# Patient Record
Sex: Male | Born: 1935
Health system: Southern US, Community
[De-identification: ages and names within clinical notes are randomized; demographics above are authoritative.]

## PROBLEM LIST (undated history)

## (undated) DIAGNOSIS — E291 Testicular hypofunction: Secondary | ICD-10-CM

## (undated) DIAGNOSIS — K648 Other hemorrhoids: Secondary | ICD-10-CM

## (undated) DIAGNOSIS — K635 Polyp of colon: Secondary | ICD-10-CM

## (undated) DIAGNOSIS — K5790 Diverticulosis of intestine, part unspecified, without perforation or abscess without bleeding: Secondary | ICD-10-CM

## (undated) DIAGNOSIS — N400 Enlarged prostate without lower urinary tract symptoms: Secondary | ICD-10-CM

## (undated) DIAGNOSIS — G629 Polyneuropathy, unspecified: Secondary | ICD-10-CM

## (undated) HISTORY — PX: INGUINAL HERNIA REPAIR: SUR1180

## (undated) HISTORY — DX: Diverticulosis of intestine, part unspecified, without perforation or abscess without bleeding: K57.90

## (undated) HISTORY — DX: Polyneuropathy, unspecified: G62.9

## (undated) HISTORY — PX: SKIN CANCER EXCISION: SHX779

## (undated) HISTORY — DX: Other hemorrhoids: K64.8

## (undated) HISTORY — DX: Polyp of colon: K63.5

## (undated) HISTORY — PX: CATARACT EXTRACTION: SUR2

## (undated) HISTORY — DX: Testicular hypofunction: E29.1

## (undated) HISTORY — DX: Benign prostatic hyperplasia without lower urinary tract symptoms: N40.0

---

## 1999-10-01 ENCOUNTER — Ambulatory Visit (HOSPITAL_COMMUNITY): Admission: RE | Admit: 1999-10-01 | Discharge: 1999-10-01 | Payer: Self-pay | Admitting: Surgery

## 1999-11-19 ENCOUNTER — Other Ambulatory Visit: Admission: RE | Admit: 1999-11-19 | Discharge: 1999-11-19 | Payer: Self-pay | Admitting: Internal Medicine

## 2000-08-24 ENCOUNTER — Ambulatory Visit (HOSPITAL_COMMUNITY): Admission: RE | Admit: 2000-08-24 | Discharge: 2000-08-24 | Payer: Self-pay | Admitting: Ophthalmology

## 2005-10-03 ENCOUNTER — Emergency Department (HOSPITAL_COMMUNITY): Admission: EM | Admit: 2005-10-03 | Discharge: 2005-10-03 | Payer: Self-pay | Admitting: Emergency Medicine

## 2006-10-15 ENCOUNTER — Ambulatory Visit: Payer: Self-pay | Admitting: Internal Medicine

## 2006-10-29 ENCOUNTER — Ambulatory Visit: Payer: Self-pay | Admitting: Internal Medicine

## 2011-07-08 ENCOUNTER — Telehealth: Payer: Self-pay | Admitting: Internal Medicine

## 2011-07-08 NOTE — Telephone Encounter (Signed)
Scheduled patient on 07/14/11 at 2:45 PM with Dr. Juanda Chance. Beth aware and will notify patient and send records.

## 2011-07-08 NOTE — Telephone Encounter (Signed)
Left a message for Angel Pittman to call me.

## 2011-07-09 ENCOUNTER — Encounter: Payer: Self-pay | Admitting: Internal Medicine

## 2011-07-14 ENCOUNTER — Other Ambulatory Visit (INDEPENDENT_AMBULATORY_CARE_PROVIDER_SITE_OTHER): Payer: Medicare Other

## 2011-07-14 ENCOUNTER — Ambulatory Visit (INDEPENDENT_AMBULATORY_CARE_PROVIDER_SITE_OTHER): Payer: Medicare Other | Admitting: Internal Medicine

## 2011-07-14 ENCOUNTER — Encounter: Payer: Self-pay | Admitting: Internal Medicine

## 2011-07-14 VITALS — BP 142/76 | HR 68 | Ht 68.25 in | Wt 154.0 lb

## 2011-07-14 DIAGNOSIS — R634 Abnormal weight loss: Secondary | ICD-10-CM

## 2011-07-14 DIAGNOSIS — Z86010 Personal history of colonic polyps: Secondary | ICD-10-CM

## 2011-07-14 NOTE — Progress Notes (Signed)
EBIN PALAZZI 1931/02/07 MRN [id number]   History of Present Illness:  This is a 77 year old, white male with weight loss from his usual 175 pounds to a current 154 pounds over the past several years. He works out  5 days a week. He has recently started to skip breakfast because he has to wait for his wife to take her medications before she can eat. Instead of this, he has been eating only two meals. His appetite is good. There is no nausea, vomiting or abdominal pain. He is concerned about being stooped or bent over when he walks which seemed to decompress his stomach to the point where he feels full. He has had normal thyroid tests, blood count and renal function as well as liver function tests. We have done numerous colonoscopies starting in 2001 subsequently in September 2004 and the last one in early 2008. He had an adenomatous polyp in 2001. He is due for a repeat colonoscopy in January 2015. He denies any change in bowel habits or rectal bleeding. He denies gastroesophageal reflux. He denies night sweats or chills. His overall energy level has been good.   Past Medical History  Diagnosis Date  . Peripheral neuropathy   . Hypogonadism male   . Internal hemorrhoids   . Diverticulosis   . Hyperplastic colon polyp   . BPH (benign prostatic hypertrophy)    Past Surgical History  Procedure Date  . Inguinal hernia repair     right  . Cataract extraction     bilateral  . Skin cancer excision     reports that he quit smoking about 33 years ago. He has never used smokeless tobacco. He reports that he drinks alcohol. He reports that he does not use illicit drugs. family history includes Cancer in his mother and Heart disease in his father. Allergies  Allergen Reactions  . Penicillins         Review of Systems: Negative for dysphagia odynophagia or shortness of breath or chest pain  The remainder of the 10  point ROS is negative except as outlined in H&P   Physical Exam: General  appearance  Well developed, in no distress. Eyes- non icteric. HEENT nontraumatic, normocephalic. Mouth no lesions, tongue papillated, no cheilosis. Neck supple without adenopathy, thyroid not enlarged, no carotid bruits, no JVD. Lungs Clear to auscultation bilaterally. Back he has kyphosis of the thoracic spine. Cor normal S1 normal S2, regular rhythm , no murmur,  quiet precordium. Abdomen soft nontender abdomen with normoactive bowel sounds. No bruits. Liver edge at costal margin soft . Rectal: Soft Hemoccult negative stool. Extremities no pedal edema. Skin no lesions, Dupuytren contractures. Neurological alert and oriented x 3. Psychological normal mood . There is no inguinal or cervical adenopathy. Good peripheral pulses  Assessment and Plan:  Problem #1 This is a 77 year old black male with a gradual weight loss of about 15 pounds without obvious cause other than decreased food intake. We need to rule out organic causes such as an occult malignancy. We will proceed with a CT scan of the abdomen and pelvis with IV and oral contrast. I will also check his visceral protein specifically prealbumin and general nutritional status. We have discussed increased caloric intake by eating breakfast in addition to lunch and supper. I advise using boost once or twice a day. He will be due for a repeat colonoscopy in 2015.    07/14/2011 Lina Sar

## 2011-07-14 NOTE — Patient Instructions (Signed)
You have been scheduled for a CT scan of the abdomen and pelvis at [facility] ([address]---this is in the same building as [facility]).   You are scheduled on Thursday 07/17/11 at 11:00 am. You should arrive 15 minutes prior to your appointment time for registration. Please follow the written instructions below on the day of your exam:  WARNING: IF YOU ARE ALLERGIC TO IODINE/X-RAY DYE, PLEASE NOTIFY RADIOLOGY IMMEDIATELY AT [phone]! YOU WILL BE GIVEN A 13 HOUR PREMEDICATION PREP.  1) Do not eat or drink anything after 7:00 am (4 hours prior to your test) 2) You have been given 2 bottles of oral contrast to drink. The solution may taste better if refrigerated, but do NOT add ice or any other liquid to this solution. Shake well before drinking.    Drink 1 bottle of contrast @ 9:00 am (2 hours prior to your exam)  Drink 1 bottle of contrast @ 10:00 am (1 hour prior to your exam)  You may take any medications as prescribed with a small amount of water except for the following: Metformin, Glucophage, Glucovance, Avandamet, Riomet, Fortamet, Actoplus Met, Janumet, Glumetza or Metaglip. The above medications must be held the day of the exam AND 48 hours after the exam.  The purpose of you drinking the oral contrast is to aid in the visualization of your intestinal tract. The contrast solution may cause some diarrhea. Before your exam is started, you will be given a small amount of fluid to drink. Depending on your individual set of symptoms, you may also receive an intravenous injection of x-ray contrast/dye. Plan on being at [facility] for 30 minutes or long, depending on the type of exam you are having performed.  If you have any questions regarding your exam or if you need to reschedule, you may call the CT department at [phone] between the hours of 8:00 am and 5:00 pm,  Monday-Friday.  ________________________________________________________________________  Your physician has requested that you go to the basement for the following lab work before leaving today: Prealbumin, BUN, Creatinine CC: Dr Catha Gosselin

## 2011-07-15 LAB — PREALBUMIN: Prealbumin: 25.6 mg/dL (ref 17.0–34.0)

## 2011-07-16 ENCOUNTER — Telehealth: Payer: Self-pay | Admitting: Internal Medicine

## 2011-07-16 NOTE — Telephone Encounter (Signed)
Unable to reach patient. No answering machine. Will try again later. 

## 2011-07-16 NOTE — Telephone Encounter (Signed)
Please send message to radiology to evaluate LS spine for spinal stenosis while they are doing the CT scan.

## 2011-07-16 NOTE — Telephone Encounter (Signed)
Patient states he found out last night that his father had lumbar spinal stenosis and the symptoms he is having are similar. He states the diagnosis for this is with MRI or CT. He wants to be sure that the CT tomorrow would show enough to diagnosis this also as he does not want to have too may CT's> ( he is having abd/pelvis CT) Please, advise.

## 2011-07-16 NOTE — Progress Notes (Signed)
Advised patient wife that labs are normal.

## 2011-07-16 NOTE — Telephone Encounter (Signed)
Spoke with Rose at [facility] and she will add the CT L spine for Spinal stenosis

## 2011-07-17 ENCOUNTER — Ambulatory Visit (INDEPENDENT_AMBULATORY_CARE_PROVIDER_SITE_OTHER)
Admission: RE | Admit: 2011-07-17 | Discharge: 2011-07-17 | Disposition: A | Discharge status: Home / Self Care | Payer: Medicare Other | Source: Ambulatory Visit | Attending: Internal Medicine | Admitting: Internal Medicine

## 2011-07-17 DIAGNOSIS — R634 Abnormal weight loss: Secondary | ICD-10-CM

## 2011-07-17 DIAGNOSIS — R109 Unspecified abdominal pain: Secondary | ICD-10-CM

## 2011-07-17 MED ORDER — IOHEXOL 300 MG/ML  SOLN: 100.0000 mL | Freq: Once | INTRAMUSCULAR | Status: DC | PRN

## 2011-07-18 ENCOUNTER — Telehealth: Payer: Self-pay | Admitting: *Deleted

## 2011-07-18 NOTE — Telephone Encounter (Signed)
Patient notified of results as per Dr. Brodie. 

## 2011-07-18 NOTE — Telephone Encounter (Signed)
Message copied by Daphine Deutscher on Fri Jul 18, 2011  8:15 AM ------      Message from: Hart Carwin      Created: Thu Jul 17, 2011  4:47 PM       I have spoken to pt's wife, pt was not at home, I called again later and left message on the phone about normal CT of the abdomen but very abnormal LS spine. Please call him to tell him that he  Will need to be seen by a specialist to evaluate  multilevel disc abnormalities which are leading to his back problems.

## 2011-07-22 ENCOUNTER — Ambulatory Visit (INDEPENDENT_AMBULATORY_CARE_PROVIDER_SITE_OTHER): Payer: Medicare Other | Admitting: Urology

## 2011-07-22 DIAGNOSIS — N529 Male erectile dysfunction, unspecified: Secondary | ICD-10-CM

## 2011-07-22 DIAGNOSIS — N400 Enlarged prostate without lower urinary tract symptoms: Secondary | ICD-10-CM

## 2011-07-22 DIAGNOSIS — E291 Testicular hypofunction: Secondary | ICD-10-CM

## 2011-07-22 DIAGNOSIS — R39198 Other difficulties with micturition: Secondary | ICD-10-CM

## 2011-07-31 ENCOUNTER — Other Ambulatory Visit: Payer: Self-pay | Admitting: Neurosurgery

## 2011-07-31 DIAGNOSIS — M545 Low back pain: Secondary | ICD-10-CM

## 2011-07-31 DIAGNOSIS — M542 Cervicalgia: Secondary | ICD-10-CM

## 2011-08-07 ENCOUNTER — Ambulatory Visit
Admission: RE | Admit: 2011-08-07 | Discharge: 2011-08-07 | Disposition: A | Discharge status: Home / Self Care | Payer: Medicare Other | Source: Ambulatory Visit | Attending: Neurosurgery | Admitting: Neurosurgery

## 2011-08-07 DIAGNOSIS — M542 Cervicalgia: Secondary | ICD-10-CM

## 2011-08-07 DIAGNOSIS — M545 Low back pain, unspecified: Secondary | ICD-10-CM

## 2011-09-30 ENCOUNTER — Other Ambulatory Visit: Payer: Self-pay | Admitting: Urology

## 2011-10-09 ENCOUNTER — Encounter (HOSPITAL_BASED_OUTPATIENT_CLINIC_OR_DEPARTMENT_OTHER): Payer: Self-pay | Admitting: *Deleted

## 2011-10-09 NOTE — Progress Notes (Addendum)
To [facility] at 0615. NPO after mn, Hg,EKG on arrival,reviewed RCC guidelines. 

## 2011-10-12 NOTE — H&P (Signed)
Urology Admission H&P  Chief Complaint: Voiding problems  History of Present Illness:   I have been seeing Angel Pittman for years for BPH and hypogonadism. He has been on AndroGel supplementation and has had appropriately normal levels of testosterone since he has been on AndroGel. He has had normal PSA checks. He had been on finasteride for BPH, but stopped that due to significant concerns about weakness. Since he stopped that, he has been feeling better. He is undergoing neurologic evaluation I Dr. Anne Hahn, and will be seen at [facility] in a few weeks. He recently stopped his testosterone as well.  Over the past year or so, he has lost 18 pounds. His appetite is somewhat diminished. His main concern is the weight loss, as well as progressive stooping, weakness of his hands and easy fatigability. He has seen Dr. Lesia Sago in the past. No specific neurologic diagnosis has been given. He has had a normal GI evaluation by Dr. Lina Sar. Recent CT the abdomen and pelvis revealed normal kidneys, bilateral bladder diverticula, and a large prostate.  He has had slowing of his urinary stream recently. He has had no gross hematuria or dysuria. At his last visit in May, he was found to have microscopic hematuria. He has not really had that before.  Evaluation was essentially negative. Because of his being off of 5ARIs, his symptoms from his prostate have worsened. He has requested intervention besides pharmacologic management. Recent cystoscopy revealed a large, obstructing prostate with an intravesical median lobe, obviating treatment with thermotherapy. He presents now for TUR-P.   Past Medical History  Diagnosis Date  . Hypogonadism male   . Internal hemorrhoids   . Diverticulosis   . Hyperplastic colon polyp   . BPH (benign prostatic hypertrophy)   . Peripheral neuropathy     toes,hands at time-denies any pain   Past Surgical History  Procedure Date  . Inguinal hernia repair     right  . Cataract  extraction     bilateral  . Skin cancer excision     Home Medications:  No prescriptions prior to admission   Allergies:  Allergies  Allergen Reactions  . Penicillins Rash    Family History  Problem Relation Age of Onset  . Cancer Mother     ?  Marland Kitchen Heart disease Father    Social History:  reports that he quit smoking about 33 years ago. He has never used smokeless tobacco. He reports that he drinks alcohol. He reports that he does not use illicit drugs.  Review of Systems  HENT: Negative.   Eyes: Negative.   Respiratory: Negative.   Cardiovascular: Negative.   Gastrointestinal: Negative.   Genitourinary: Negative.   Musculoskeletal: Negative.   Skin: Negative.   Neurological: Positive for weakness.       Generalized weakness  All other systems reviewed and are negative.    Physical Exam:  Vital signs in last 24 hours:   Physical Exam  Vitals reviewed. Constitutional: He is oriented to person, place, and time. He appears well-developed and well-nourished.  HENT:  Head: Normocephalic and atraumatic.  Eyes: Conjunctivae are normal. Pupils are equal, round, and reactive to light.  Neck: Normal range of motion. Neck supple.  Cardiovascular: Normal rate, regular rhythm and normal heart sounds.   Respiratory: Effort normal and breath sounds normal.  GI: Soft. Bowel sounds are normal.  Genitourinary: Rectum normal and penis normal.       Prostate enlarged  Musculoskeletal: Normal range of motion.  Neurological: He  is alert and oriented to person, place, and time.  Skin: Skin is warm and dry.  Psychiatric: He has a normal mood and affect. His behavior is normal.    Laboratory Data:  No results found for this or any previous visit (from the past 24 hour(s)). No results found for this or any previous visit (from the past 240 hour(s)). Creatinine: No results found for this basename: CREATININE:7 in the last 168 hours Baseline Creatinine:    Impression/Assessment:    BPH--significant voiding symptoms, intolerant of pharmacologic therapy  Plan:  TUR-P  Marcine Matar M 10/12/2011, 4:20 PM

## 2011-10-13 ENCOUNTER — Other Ambulatory Visit: Payer: Self-pay

## 2011-10-13 ENCOUNTER — Encounter (HOSPITAL_BASED_OUTPATIENT_CLINIC_OR_DEPARTMENT_OTHER)
Admission: RE | Disposition: A | Discharge status: Home / Self Care | Payer: Self-pay | Source: Ambulatory Visit | Attending: Urology

## 2011-10-13 ENCOUNTER — Encounter (HOSPITAL_BASED_OUTPATIENT_CLINIC_OR_DEPARTMENT_OTHER): Payer: Self-pay | Admitting: *Deleted

## 2011-10-13 ENCOUNTER — Ambulatory Visit (HOSPITAL_BASED_OUTPATIENT_CLINIC_OR_DEPARTMENT_OTHER)
Admission: RE | Admit: 2011-10-13 | Discharge: 2011-10-14 | Disposition: A | Discharge status: Home / Self Care | Payer: Medicare Other | Source: Ambulatory Visit | Attending: Urology | Admitting: Urology

## 2011-10-13 ENCOUNTER — Encounter (HOSPITAL_BASED_OUTPATIENT_CLINIC_OR_DEPARTMENT_OTHER): Payer: Self-pay | Admitting: Anesthesiology

## 2011-10-13 ENCOUNTER — Other Ambulatory Visit: Payer: Self-pay | Admitting: Urology

## 2011-10-13 ENCOUNTER — Ambulatory Visit (HOSPITAL_BASED_OUTPATIENT_CLINIC_OR_DEPARTMENT_OTHER): Payer: Medicare Other | Admitting: Anesthesiology

## 2011-10-13 DIAGNOSIS — Z860100 Personal history of colon polyps, unspecified: Secondary | ICD-10-CM | POA: Insufficient documentation

## 2011-10-13 DIAGNOSIS — N320 Bladder-neck obstruction: Secondary | ICD-10-CM | POA: Insufficient documentation

## 2011-10-13 DIAGNOSIS — N138 Other obstructive and reflux uropathy: Secondary | ICD-10-CM | POA: Insufficient documentation

## 2011-10-13 DIAGNOSIS — Z01810 Encounter for preprocedural cardiovascular examination: Secondary | ICD-10-CM | POA: Insufficient documentation

## 2011-10-13 DIAGNOSIS — R634 Abnormal weight loss: Secondary | ICD-10-CM | POA: Insufficient documentation

## 2011-10-13 DIAGNOSIS — R5381 Other malaise: Secondary | ICD-10-CM | POA: Insufficient documentation

## 2011-10-13 DIAGNOSIS — N400 Enlarged prostate without lower urinary tract symptoms: Secondary | ICD-10-CM

## 2011-10-13 DIAGNOSIS — N323 Diverticulum of bladder: Secondary | ICD-10-CM | POA: Insufficient documentation

## 2011-10-13 DIAGNOSIS — G608 Other hereditary and idiopathic neuropathies: Secondary | ICD-10-CM | POA: Insufficient documentation

## 2011-10-13 DIAGNOSIS — N401 Enlarged prostate with lower urinary tract symptoms: Secondary | ICD-10-CM | POA: Insufficient documentation

## 2011-10-13 HISTORY — PX: TRANSURETHRAL RESECTION OF PROSTATE: SHX73

## 2011-10-13 SURGERY — TRANSURETHRAL RESECTION OF THE PROSTATE WITH GYRUS INSTRUMENTS
Anesthesia: General | Site: Bladder | Wound class: Clean Contaminated

## 2011-10-13 MED ORDER — ONE-DAILY MULTI VITAMINS PO TABS: 1.0000 | ORAL_TABLET | Freq: Every day | ORAL | Status: DC

## 2011-10-13 MED ORDER — ONDANSETRON HCL 4 MG/2ML IJ SOLN
INTRAMUSCULAR | Status: DC | PRN
  Administered 2011-10-13: 4 mg via INTRAVENOUS

## 2011-10-13 MED ORDER — SODIUM CHLORIDE 0.9 % IR SOLN
Status: DC | PRN
  Administered 2011-10-13: 51000 mL

## 2011-10-13 MED ORDER — FENTANYL CITRATE 0.05 MG/ML IJ SOLN
25.0000 ug | INTRAMUSCULAR | Status: DC | PRN
  Administered 2011-10-13 (×2): 25 ug via INTRAVENOUS

## 2011-10-13 MED ORDER — DOCUSATE SODIUM 100 MG PO CAPS
100.0000 mg | ORAL_CAPSULE | Freq: Two times a day (BID) | ORAL | Status: DC
  Administered 2011-10-13: 100 mg via ORAL

## 2011-10-13 MED ORDER — PROPOFOL 10 MG/ML IV EMUL
INTRAVENOUS | Status: DC | PRN
  Administered 2011-10-13: 130 mg via INTRAVENOUS

## 2011-10-13 MED ORDER — ACETAMINOPHEN 325 MG PO TABS: 650.0000 mg | ORAL_TABLET | ORAL | Status: DC | PRN

## 2011-10-13 MED ORDER — STERILE WATER FOR IRRIGATION IR SOLN
Status: DC | PRN
  Administered 2011-10-13: 500 mL

## 2011-10-13 MED ORDER — BELLADONNA ALKALOIDS-OPIUM 16.2-60 MG RE SUPP
RECTAL | Status: DC | PRN
  Administered 2011-10-13: 1 via RECTAL

## 2011-10-13 MED ORDER — FENTANYL CITRATE 0.05 MG/ML IJ SOLN
INTRAMUSCULAR | Status: DC | PRN
  Administered 2011-10-13: 50 ug via INTRAVENOUS
  Administered 2011-10-13: 25 ug via INTRAVENOUS
  Administered 2011-10-13 (×2): 50 ug via INTRAVENOUS
  Administered 2011-10-13: 25 ug via INTRAVENOUS

## 2011-10-13 MED ORDER — CALCIUM GLUCONATE 500 MG PO TABS: 500.0000 mg | ORAL_TABLET | Freq: Every day | ORAL | Status: DC

## 2011-10-13 MED ORDER — HYDROCODONE-ACETAMINOPHEN 5-325 MG PO TABS: 1.0000 | ORAL_TABLET | ORAL | Status: DC | PRN

## 2011-10-13 MED ORDER — GLYCOPYRROLATE 0.2 MG/ML IJ SOLN
INTRAMUSCULAR | Status: DC | PRN
  Administered 2011-10-13 (×2): 0.2 mg via INTRAVENOUS

## 2011-10-13 MED ORDER — PROMETHAZINE HCL 25 MG/ML IJ SOLN: 6.2500 mg | INTRAMUSCULAR | Status: DC | PRN

## 2011-10-13 MED ORDER — CIPROFLOXACIN HCL 250 MG PO TABS
250.0000 mg | ORAL_TABLET | Freq: Two times a day (BID) | ORAL | Status: AC
  Administered 2011-10-13: 250 mg via ORAL

## 2011-10-13 MED ORDER — ZOLPIDEM TARTRATE 5 MG PO TABS: 5.0000 mg | ORAL_TABLET | Freq: Every evening | ORAL | Status: DC | PRN

## 2011-10-13 MED ORDER — SODIUM CHLORIDE 0.9 % IV SOLN: 250.0000 mL | INTRAVENOUS | Status: DC

## 2011-10-13 MED ORDER — LIDOCAINE HCL (CARDIAC) 20 MG/ML IV SOLN
INTRAVENOUS | Status: DC | PRN
  Administered 2011-10-13: 60 mg via INTRAVENOUS

## 2011-10-13 MED ORDER — BELLADONNA ALKALOIDS-OPIUM 16.2-60 MG RE SUPP: 1.0000 | Freq: Four times a day (QID) | RECTAL | Status: DC | PRN

## 2011-10-13 MED ORDER — SODIUM CHLORIDE 0.9 % IJ SOLN: 3.0000 mL | INTRAMUSCULAR | Status: DC | PRN

## 2011-10-13 MED ORDER — DEXAMETHASONE SODIUM PHOSPHATE 4 MG/ML IJ SOLN
INTRAMUSCULAR | Status: DC | PRN
  Administered 2011-10-13: 10 mg via INTRAVENOUS

## 2011-10-13 MED ORDER — LACTATED RINGERS IV SOLN
INTRAVENOUS | Status: DC
  Administered 2011-10-13 (×2): via INTRAVENOUS

## 2011-10-13 MED ORDER — CIPROFLOXACIN IN D5W 400 MG/200ML IV SOLN
400.0000 mg | INTRAVENOUS | Status: AC
  Administered 2011-10-13: 400 mg via INTRAVENOUS

## 2011-10-13 MED ORDER — LACTATED RINGERS IV SOLN
INTRAVENOUS | Status: DC
  Administered 2011-10-13: 17:00:00 via INTRAVENOUS

## 2011-10-13 SURGICAL SUPPLY — 34 items
BAG DRAIN URO-CYSTO SKYTR STRL (DRAIN) ×2 IMPLANT
BAG DRN ANRFLXCHMBR STRAP LEK (BAG) ×1
BAG DRN UROCATH (DRAIN) ×1
BAG URINE DRAINAGE (UROLOGICAL SUPPLIES) ×1 IMPLANT
BAG URINE LEG 19OZ MD ST LTX (BAG) ×1 IMPLANT
CANISTER SUCT LVC 12 LTR MEDI- (MISCELLANEOUS) ×5 IMPLANT
CATH FOLEY 2WAY SLVR  5CC 20FR (CATHETERS)
CATH FOLEY 2WAY SLVR  5CC 22FR (CATHETERS)
CATH FOLEY 2WAY SLVR 30CC 22FR (CATHETERS) IMPLANT
CATH FOLEY 2WAY SLVR 5CC 20FR (CATHETERS) IMPLANT
CATH FOLEY 2WAY SLVR 5CC 22FR (CATHETERS) IMPLANT
CATH FOLEY 3WAY 30CC 22F (CATHETERS) IMPLANT
CATH FOLEY 3WAY 30CC 22FR (CATHETERS) ×1 IMPLANT
CATH HEMA 3WAY 30CC 24FR COUDE (CATHETERS) IMPLANT
CATH HEMA 3WAY 30CC 24FR RND (CATHETERS) IMPLANT
CLOTH BEACON ORANGE TIMEOUT ST (SAFETY) ×2 IMPLANT
DRAPE CAMERA CLOSED 9X96 (DRAPES) ×2 IMPLANT
ELECT BUTTON HF 24-28F 2 30DE (ELECTRODE) ×2 IMPLANT
ELECT LOOP MED HF 24F 12D CBL (CLIP) IMPLANT
ELECT REM PT RETURN 9FT ADLT (ELECTROSURGICAL)
ELECTRODE REM PT RTRN 9FT ADLT (ELECTROSURGICAL) ×1 IMPLANT
EVACUATOR MICROVAS BLADDER (UROLOGICAL SUPPLIES) ×1 IMPLANT
GLOVE BIO SURGEON STRL SZ 6.5 (GLOVE) ×1 IMPLANT
GLOVE BIO SURGEON STRL SZ8 (GLOVE) IMPLANT
GLOVE ECLIPSE 6.0 STRL STRAW (GLOVE) ×1 IMPLANT
GOWN PREVENTION PLUS LG XLONG (DISPOSABLE) ×2 IMPLANT
GOWN STRL REIN XL XLG (GOWN DISPOSABLE) ×2 IMPLANT
HOLDER FOLEY CATH W/STRAP (MISCELLANEOUS) IMPLANT
IV NS IRRIG 3000ML ARTHROMATIC (IV SOLUTION) ×16 IMPLANT
KIT ASPIRATION TUBING (SET/KITS/TRAYS/PACK) IMPLANT
PACK CYSTOSCOPY (CUSTOM PROCEDURE TRAY) ×2 IMPLANT
PLUG CATH AND CAP STER (CATHETERS) IMPLANT
SYR 30ML LL (SYRINGE) ×1 IMPLANT
SYRINGE IRR TOOMEY STRL 70CC (SYRINGE) IMPLANT

## 2011-10-13 NOTE — Op Note (Signed)
Preoperative diagnosis: 1. Bladder outlet obstruction secondary to BPH  Postoperative diagnosis:  1. Bladder outlet obstruction secondary to BPH  Procedure:  1. Cystoscopy 2. Transurethral resection of the prostate  Surgeon: Bertram Millard. Cameron Schwinn, M.D.  Anesthesia: General  Complications: None  Drain: Foley catheter  EBL: Minimal  Specimens: 1. Prostate chips  Disposition of specimens: Pathology  Indication: Angel Pittman is a patient with bladder outlet obstruction secondary to benign prostatic hyperplasia. After reviewing the management options for treatment, he elected to proceed with the above surgical procedure(s). We have discussed the potential benefits and risks of the procedure, side effects of the proposed treatment, the likelihood of the patient achieving the goals of the procedure, and any potential problems that might occur during the procedure or recuperation. Informed consent has been obtained.  Description of procedure:  The patient was identified in the holding area.He received preoperative antibiotics. He was then taken to the operating room. General anesthetic was administered.  The patient was then placed in the dorsal lithotomy position, prepped and draped in the usual sterile fashion. Timeout was then performed.  A resectoscope sheath was placed using the obturator, and the resectoscope,Gyrus  loop and telescope were placed.  The bladder was then systematically examined in its entirety. There was no evidence of  tumors, stones, or other mucosal pathology.  The ureteral orifices were identified and marked so as to be avoided during the procedure.  The prostate adenoma was then resected utilizing loop cautery resection with the monopolar/bipolar cutting loop.  The prostate adenoma from the bladder neck back to the verumontanum was resected beginning at the six o'clock position and then extended to include the right and left lobes of the prostate and anterior  prostate, respectively.. Care was taken not to resect distal to the verumontanum.   The prostate adenoma was then vaporized utilizing the bipolar button.  The prostate adenoma from the bladder neck back to the verumontanum was vaporized beginning at the six o'clock position and then extended to include the right and left lobes of the prostate and anterior prostate. Care was taken not to vaporize distal to the verumontanum.  Hemostasis was then achieved with the cautery and the bladder was emptied and reinspected with no significant bleeding noted at the end of the procedure.    A 3 way catheter was then placed into the bladder and placed on continuous bladder irrigation.  The patient appeared to tolerate the procedure well and without complications.  The patient was able to be awakened and transferred to the recovery unit in satisfactory condition.

## 2011-10-13 NOTE — Anesthesia Procedure Notes (Signed)
Procedure Name: LMA Insertion Date/Time: 10/13/2011 7:42 AM Performed by: Huel Coventry Pre-anesthesia Checklist: Patient identified, Emergency Drugs available, Suction available and Patient being monitored Patient Re-evaluated:Patient Re-evaluated prior to inductionOxygen Delivery Method: Circle System Utilized Preoxygenation: Pre-oxygenation with 100% oxygen Intubation Type: IV induction Ventilation: Mask ventilation without difficulty LMA: LMA inserted LMA Size: 4.0 Number of attempts: 1 Airway Equipment and Method: bite block Placement Confirmation: positive ETCO2 Tube secured with: Tape Dental Injury: Teeth and Oropharynx as per pre-operative assessment

## 2011-10-13 NOTE — Interval H&P Note (Signed)
History and Physical Interval Note:  10/13/2011 7:34 AM  Angel Pittman  has presented today for surgery, with the diagnosis of benigh prostatic hypertrophy  The various methods of treatment have been discussed with the patient and family. After consideration of risks, benefits and other options for treatment, the patient has consented to  Procedure(s): TRANSURETHRAL RESECTION OF THE PROSTATE WITH GYRUS INSTRUMENTS as a surgical intervention .  The patients' history has been reviewed, patient examined, no change in status, stable for surgery.  I have reviewed the patients' chart and labs.  Questions were answered to the patient's satisfaction.     [name]

## 2011-10-13 NOTE — Transfer of Care (Signed)
Immediate Anesthesia Transfer of Care Note  Patient: Angel Pittman  Procedure(s) Performed:  TRANSURETHRAL RESECTION OF THE PROSTATE WITH GYRUS INSTRUMENTS  Patient Location: PACU  Anesthesia Type: General  Level of Consciousness: awake, alert  and oriented  Airway & Oxygen Therapy: Patient Spontanous Breathing and Patient connected to face mask oxygen  Post-op Assessment: Report given to PACU RN and Post -op Vital signs reviewed and stable  Post vital signs: Reviewed and stable  Complications: No apparent anesthesia complications

## 2011-10-13 NOTE — H&P (View-Only) (Signed)
To [facility] at 0615. NPO after mn, Hg,EKG on arrival,reviewed RCC guidelines.

## 2011-10-13 NOTE — Anesthesia Preprocedure Evaluation (Addendum)
Anesthesia Evaluation  Patient identified by MRN, date of birth, ID band Patient awake    Reviewed: Allergy & Precautions, H&P , NPO status , Patient's Chart, lab work & pertinent test results, reviewed documented beta blocker date and time   Airway Mallampati: II TM Distance: >3 FB Neck ROM: full    Dental  (+) Chipped   Pulmonary neg pulmonary ROS,  clear to auscultation  Pulmonary exam normal       Cardiovascular Exercise Tolerance: Good neg cardio ROS regular Normal    Neuro/Psych  Neuromuscular disease Negative Psych ROS   GI/Hepatic negative GI ROS, Neg liver ROS,   Endo/Other  Negative Endocrine ROS  Renal/GU negative Renal ROS  Genitourinary negative   Musculoskeletal   Abdominal Normal abdominal exam  (+)   Peds  Hematology negative hematology ROS (+)   Anesthesia Other Findings Prior dental repair to L upper incisor  Reproductive/Obstetrics negative OB ROS                          Anesthesia Physical Anesthesia Plan  ASA: I  Anesthesia Plan: General LMA   Post-op Pain Management:    Induction:   Airway Management Planned:   Additional Equipment:   Intra-op Plan:   Post-operative Plan:   Informed Consent: I have reviewed the patients History and Physical, chart, labs and discussed the procedure including the risks, benefits and alternatives for the proposed anesthesia with the patient or authorized representative who has indicated his/her understanding and acceptance.   Dental Advisory Given  Plan Discussed with: CRNA  Anesthesia Plan Comments:         Anesthesia Quick Evaluation

## 2011-10-13 NOTE — Anesthesia Postprocedure Evaluation (Signed)
Anesthesia Post Note  Patient: Angel Pittman  Procedure(s) Performed:  TRANSURETHRAL RESECTION OF THE PROSTATE WITH GYRUS INSTRUMENTS  Anesthesia type: General  Patient location: PACU  Post pain: Pain level controlled  Post assessment: Post-op Vital signs reviewed  Last Vitals:  Filed Vitals:   10/13/11 0944  BP: 146/75  Pulse: 65  Temp: 35.6 C  Resp: 12    Post vital signs: Reviewed  Level of consciousness: sedated  Complications: No apparent anesthesia complications

## 2011-10-14 MED ORDER — CIPROFLOXACIN HCL 250 MG PO TABS: 250.0000 mg | ORAL_TABLET | Freq: Two times a day (BID) | ORAL | Status: AC

## 2011-10-17 ENCOUNTER — Encounter (HOSPITAL_BASED_OUTPATIENT_CLINIC_OR_DEPARTMENT_OTHER): Payer: Self-pay | Admitting: Urology

## 2011-10-18 ENCOUNTER — Encounter (HOSPITAL_COMMUNITY): Payer: Self-pay | Admitting: *Deleted

## 2011-10-18 ENCOUNTER — Emergency Department (HOSPITAL_COMMUNITY)
Admission: EM | Admit: 2011-10-18 | Discharge: 2011-10-18 | Disposition: A | Discharge status: Home / Self Care | Payer: Medicare Other | Attending: Emergency Medicine | Admitting: Emergency Medicine

## 2011-10-18 DIAGNOSIS — Z9889 Other specified postprocedural states: Secondary | ICD-10-CM | POA: Insufficient documentation

## 2011-10-18 DIAGNOSIS — R339 Retention of urine, unspecified: Secondary | ICD-10-CM | POA: Insufficient documentation

## 2011-10-18 DIAGNOSIS — R300 Dysuria: Secondary | ICD-10-CM | POA: Insufficient documentation

## 2011-10-18 LAB — URINALYSIS, ROUTINE W REFLEX MICROSCOPIC
Ketones, ur: NEGATIVE mg/dL
Nitrite: NEGATIVE
Protein, ur: 100 mg/dL — AB
pH: 6 (ref 5.0–8.0)

## 2011-10-18 LAB — URINE MICROSCOPIC-ADD ON

## 2011-10-18 NOTE — ED Notes (Signed)
Pt had a TURP on the 17th, weak stream yesterday, earlier today dribbling then quit and unable to push anything out

## 2011-10-18 NOTE — ED Provider Notes (Signed)
History     CSN: [id number]  Arrival date & time 10/18/11  1517   First MD Initiated Contact with Patient 10/18/11 1530      Chief Complaint  Patient presents with  . Dysuria    (Consider location/radiation/quality/duration/timing/severity/associated sxs/prior treatment) The history is provided by the patient.  the patient reports TURP by Dr Hillis Range on Dec 17th, 5 days ago. Reports went home on the 18th urinating normally without a foley. Developed "dribbling" yesterday, now has been unable to void since noon. No dysuria. No vomiting. No flank pain. No hematuria prior to the urinary retention. TURP was for difficulty developing a good stream with urinating as he was unable to tolerate the medications for weak urinary stream. No fevers. Nothing worsens or improves his symptoms. They are constant. They are moderate in severity  Past Medical History  Diagnosis Date  . Hypogonadism male   . Internal hemorrhoids   . Diverticulosis   . Hyperplastic colon polyp   . BPH (benign prostatic hypertrophy)   . Peripheral neuropathy     toes,hands at time-denies any pain    Past Surgical History  Procedure Date  . Inguinal hernia repair     right  . Cataract extraction     bilateral  . Skin cancer excision   . Transurethral resection of prostate 10/13/2011    Procedure: TRANSURETHRAL RESECTION OF THE PROSTATE WITH GYRUS INSTRUMENTS;  Surgeon: Marcine Matar;  Location: [facility];  Service: Urology;  Laterality: N/A;    Family History  Problem Relation Age of Onset  . Cancer Mother     ?  Marland Kitchen Heart disease Father     History  Substance Use Topics  . Smoking status: Former Smoker -- 0.2 packs/day    Quit date: 10/27/1977  . Smokeless tobacco: Never Used  . Alcohol Use: Yes     occasionally wine, beer      Review of Systems  Genitourinary: Positive for dysuria.  All other systems reviewed and are negative.    Allergies  Penicillins  Home  Medications   Current Outpatient Rx  Name Route Sig Dispense Refill  . ASPIRIN 81 MG PO CHEW Oral Chew 81 mg by mouth daily.      . B COMPLEX PO TABS Oral Take 1 tablet by mouth daily.      Marland Kitchen CALCIUM GLUCONATE 500 MG PO TABS Oral Take 500 mg by mouth daily.      Marland Kitchen CIPROFLOXACIN HCL 250 MG PO TABS Oral Take 1 tablet (250 mg total) by mouth 2 (two) times daily. 6 tablet 0  . ONE-DAILY MULTI VITAMINS PO TABS Oral Take 1 tablet by mouth daily.      . TESTOSTERONE 1.25 GM/ACT (1%) TD GEL Transdermal Place 4 application onto the skin daily.        BP 154/62  Pulse 56  Temp(Src) 98.5 F (36.9 C) (Oral)  Resp 16  SpO2 100%  Physical Exam  Nursing note and vitals reviewed. Constitutional: He is oriented to person, place, and time. He appears well-developed and well-nourished.  HENT:  Head: Normocephalic and atraumatic.  Neck: Normal range of motion.  Cardiovascular: Normal rate, regular rhythm, normal heart sounds and intact distal pulses.   Pulmonary/Chest: Effort normal and breath sounds normal. No respiratory distress.  Abdominal: Soft. He exhibits no distension. There is no tenderness.  Genitourinary: Penis normal. No penile tenderness.  Musculoskeletal: Normal range of motion.  Neurological: He is alert and oriented to person, place, and time.  Skin: Skin is warm and dry.  Psychiatric: He has a normal mood and affect. Judgment normal.    ED Course  Procedures (including critical care time)  Labs Reviewed  URINALYSIS, ROUTINE W REFLEX MICROSCOPIC - Abnormal; Notable for the following:    APPearance CLOUDY (*)    Hgb urine dipstick LARGE (*)    Protein, ur 100 (*)    Leukocytes, UA SMALL (*)    All other components within normal limits  URINE MICROSCOPIC-ADD ON  URINE CULTURE   No results found.   1. Urinary retention       MDM  Urinary retention. Will place foley and plan for dc home with leg bag and referral back to urology.   The patient reports complete  resolution of his symptoms after placement of a Foley catheter.  The family returned 850 cc of yellow-colored urine without hematuria or significant sediment.  Home with Foley catheter and follow up with his urologist.  Instructed to return to the ER for worsening symptoms        Lyanne Co, MD 10/18/11 1702

## 2011-10-18 NOTE — ED Notes (Signed)
Patient's urinary drainage bag is changed to leg bag for d/c home.  Patient is given large drain bag to use at night.  Instructions given as how to change & drain bags.  Patient verbalizes understanding.

## 2011-10-19 LAB — URINE CULTURE

## 2013-09-19 ENCOUNTER — Encounter: Payer: Self-pay | Admitting: Internal Medicine

## 2014-03-29 ENCOUNTER — Encounter: Payer: Self-pay | Admitting: Internal Medicine

## 2016-11-18 DIAGNOSIS — M6281 Muscle weakness (generalized): Secondary | ICD-10-CM | POA: Diagnosis not present

## 2016-11-18 DIAGNOSIS — E559 Vitamin D deficiency, unspecified: Secondary | ICD-10-CM | POA: Diagnosis not present

## 2016-11-18 DIAGNOSIS — G1221 Amyotrophic lateral sclerosis: Secondary | ICD-10-CM | POA: Diagnosis not present

## 2016-11-18 DIAGNOSIS — Z5321 Procedure and treatment not carried out due to patient leaving prior to being seen by health care provider: Secondary | ICD-10-CM | POA: Diagnosis not present

## 2017-04-08 DIAGNOSIS — E559 Vitamin D deficiency, unspecified: Secondary | ICD-10-CM | POA: Diagnosis not present

## 2017-11-24 DIAGNOSIS — R5383 Other fatigue: Secondary | ICD-10-CM | POA: Diagnosis not present

## 2017-11-24 DIAGNOSIS — R471 Dysarthria and anarthria: Secondary | ICD-10-CM | POA: Diagnosis not present

## 2017-11-24 DIAGNOSIS — G1221 Amyotrophic lateral sclerosis: Secondary | ICD-10-CM | POA: Diagnosis not present

## 2017-11-24 DIAGNOSIS — Z9181 History of falling: Secondary | ICD-10-CM | POA: Diagnosis not present

## 2017-11-24 DIAGNOSIS — E559 Vitamin D deficiency, unspecified: Secondary | ICD-10-CM | POA: Diagnosis not present

## 2017-11-24 DIAGNOSIS — E538 Deficiency of other specified B group vitamins: Secondary | ICD-10-CM | POA: Diagnosis not present

## 2018-11-04 ENCOUNTER — Other Ambulatory Visit: Payer: Self-pay | Admitting: Family Medicine

## 2018-11-04 DIAGNOSIS — Z23 Encounter for immunization: Secondary | ICD-10-CM | POA: Diagnosis not present

## 2018-11-04 DIAGNOSIS — N402 Nodular prostate without lower urinary tract symptoms: Secondary | ICD-10-CM | POA: Diagnosis not present

## 2018-11-04 DIAGNOSIS — R5383 Other fatigue: Secondary | ICD-10-CM | POA: Diagnosis not present

## 2018-11-04 DIAGNOSIS — N6320 Unspecified lump in the left breast, unspecified quadrant: Secondary | ICD-10-CM

## 2018-11-04 DIAGNOSIS — R0609 Other forms of dyspnea: Secondary | ICD-10-CM | POA: Diagnosis not present

## 2018-11-04 DIAGNOSIS — R634 Abnormal weight loss: Secondary | ICD-10-CM | POA: Diagnosis not present

## 2018-11-04 DIAGNOSIS — R8290 Unspecified abnormal findings in urine: Secondary | ICD-10-CM | POA: Diagnosis not present

## 2018-11-08 ENCOUNTER — Encounter: Payer: Self-pay | Admitting: Cardiovascular Disease

## 2018-11-08 ENCOUNTER — Ambulatory Visit (INDEPENDENT_AMBULATORY_CARE_PROVIDER_SITE_OTHER): Payer: Medicare Other | Admitting: Cardiovascular Disease

## 2018-11-08 VITALS — BP 118/64 | HR 67 | Ht 70.5 in | Wt 138.8 lb

## 2018-11-08 DIAGNOSIS — G1221 Amyotrophic lateral sclerosis: Secondary | ICD-10-CM | POA: Diagnosis not present

## 2018-11-08 DIAGNOSIS — R5383 Other fatigue: Secondary | ICD-10-CM

## 2018-11-08 DIAGNOSIS — R0609 Other forms of dyspnea: Secondary | ICD-10-CM | POA: Diagnosis not present

## 2018-11-08 DIAGNOSIS — R634 Abnormal weight loss: Secondary | ICD-10-CM

## 2018-11-08 NOTE — Progress Notes (Signed)
Cardiology Office Note    Date:  11/09/2018   ID:  Angel Pittman, Angel Pittman 02-15-1931, MRN [id number]  PCP:  Catha Gosselin, MD  Cardiologist:  Nicki Guadalajara, MD   Chief Complaint  Patient presents with  . Shortness of Breath    with actiivity also states having no energy   New cardiology evaluation referred through the courtesy of Dr. Catha Gosselin for evaluation of progressive exertional shortness of breath.  History of Present Illness:  Angel Pittman is a 85 y.o. male who has a history of ALS diagnosed in 2015 and currently in a plateau phase followed at [facility].  He is followed by Dr. Catha Gosselin for primary care.  The patient has noticed fatigue and recent progressive increasing shortness of breath with minimal activity.  He denies any associated chest tightness.  He denies associated palpitations.  He denies any presyncope or syncope.  He has had significant weight loss reportedly had weighed 185 pounds approximately 3 years ago with his weight recently being reduced to approximately 138 pounds.  He was recently seen by Dr. Catha Gosselin.  The wife has noticed that if he tries to dry his hair or even shave sometimes he has to stop and rest due to fatigability.  He is not very active.  He presents for cardiology evaluation.  The patient believes that he is sleeping well.  There is a history of snoring.  Epworth Sleepiness Scale score was calculated in the office today and this endorsed at 5 arguing against excessive daytime sleepiness.   Epworth Sleepiness Scale: Situation   Chance of Dozing/Sleeping (0 = never , 1 = slight chance , 2 = moderate chance , 3 = high chance )   sitting and reading 0   watching TV 1   sitting inactive in a public place 0   being a passenger in a motor vehicle for an hour or more 1   lying down in the afternoon 2   sitting and talking to someone 0   sitting quietly after lunch (no alcohol) 1   while stopped for a few minutes in traffic as the driver 0   Total Score  5     Past Medical History:  Diagnosis Date  . BPH (benign prostatic hypertrophy)   . Diverticulosis   . Hyperplastic colon polyp   . Hypogonadism male   . Internal hemorrhoids   . Peripheral neuropathy    toes,hands at time-denies any pain    Past Surgical History:  Procedure Laterality Date  . CATARACT EXTRACTION     bilateral  . INGUINAL HERNIA REPAIR     right  . SKIN CANCER EXCISION    . TRANSURETHRAL RESECTION OF PROSTATE  10/13/2011   Procedure: TRANSURETHRAL RESECTION OF THE PROSTATE WITH GYRUS INSTRUMENTS;  Surgeon: Marcine Matar;  Location: [facility];  Service: Urology;  Laterality: N/A;    Current Medications: Outpatient Medications Prior to Visit  Medication Sig Dispense Refill  . Cyanocobalamin (VITAMIN B 12 PO) Take 1 tablet by mouth daily.    . Fish Oil-Cholecalciferol (OMEGA-3 + VITAMIN D3 PO) Take 1 tablet by mouth daily.    . Multiple Vitamin (MULTIVITAMIN) tablet Take 1 tablet by mouth daily.      Marland Kitchen aspirin 81 MG chewable tablet Chew 81 mg by mouth daily.      Marland Kitchen b complex vitamins tablet Take 1 tablet by mouth daily.      . calcium gluconate 500 MG tablet Take 500  mg by mouth daily.      . Testosterone 1.25 GM/ACT (1%) GEL Place 4 application onto the skin daily.       No facility-administered medications prior to visit.      Allergies:   Penicillins   Social History   Socioeconomic History  . Marital status: Married    Spouse name: Not on file  . Number of children: 2  . Years of education: Not on file  . Highest education level: Not on file  Occupational History  . Occupation: retired  Engineer, productionocial Needs  . Financial resource strain: Not on file  . Food insecurity:    Worry: Not on file    Inability: Not on file  . Transportation needs:    Medical: Not on file    Non-medical: Not on file  Tobacco Use  . Smoking status: Former Smoker    Packs/day: 0.25    Last attempt to quit: 10/27/1977    Years since  quitting: 41.0  . Smokeless tobacco: Never Used  Substance and Sexual Activity  . Alcohol use: Yes    Comment: occasionally wine, beer  . Drug use: No  . Sexual activity: Not on file  Lifestyle  . Physical activity:    Days per week: Not on file    Minutes per session: Not on file  . Stress: Not on file  Relationships  . Social connections:    Talks on phone: Not on file    Gets together: Not on file    Attends religious service: Not on file    Active member of club or organization: Not on file    Attends meetings of clubs or organizations: Not on file    Relationship status: Not on file  Other Topics Concern  . Not on file  Social History Narrative  . Not on file     Social history is notable in [place]  He is married for 57 years.  He has 4 children, 10 grandchildren, and 10 great grandchildren. 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  He retired at age 85.  He had smoked in his early years but quit smoking 40 years ago.  Family History:  The patient's family history includes Cancer in his mother; Heart disease in his father.  Both parents are deceased.  His father died at age 83 with a myocardial infarction.  ROS General: Negative; No fevers, chills, or night sweats; sniffing and fatigue HEENT: Negative; No changes in vision or hearing, sinus congestion, difficulty swallowing Pulmonary: Negative; No cough, wheezing, shortness of breath, hemoptysis Cardiovascular: Shortness of breath with minimal activity; no chest pain, presyncope, syncope, palpitations GI: Negative; No nausea, vomiting, diarrhea, or abdominal pain GU: Negative; No dysuria, hematuria, or difficulty voiding Musculoskeletal: Negative; no myalgias, joint pain, or weakness Hematologic/Oncology: Negative; no easy bruising, bleeding Endocrine: Negative; no heat/cold intolerance; no diabetes Neuro: ALS currently in plateau phase, [facility]. Skin:  Negative; No rashes or skin lesions Psychiatric: Negative; No behavioral problems, depression Sleep: Negative; No snoring, daytime sleepiness, hypersomnolence, bruxism, restless legs, hypnogognic hallucinations, no cataplexy Other comprehensive 14 point system review is negative.   PHYSICAL EXAM:   VS:  BP 118/64 (BP Location: Right Arm)   Pulse 67   Ht 5' 10.5" (1.791 m)   Wt 138 lb 12.8 oz (63 kg)   BMI 19.63 kg/m     Repeat blood pressure by me was 118/66 supine and 116/64 standing  Wt Readings from Last 3 Encounters:  11/08/18 138 lb 12.8 oz (63 kg)  10/09/11 158 lb (71.7 kg)  07/14/11 154 lb (69.9 kg)    General: Alert, oriented, no distress.  Thin.  BMI 19.6. Skin: normal turgor, no rashes, warm and dry HEENT: Normocephalic, atraumatic. Pupils equal round and reactive to light; sclera anicteric; extraocular muscles intact; Fundi no hemorrhages or exudates. Nose without nasal septal hypertrophy Mouth/Parynx benign; Mallinpatti scale 2 Neck: No JVD, no carotid bruits; normal carotid upstroke Lungs: clear to ausculatation and percussion; no wheezing or rales Chest wall: without tenderness to palpitation Heart: PMI not displaced, RRR, s1 s2 normal, 1/6 systolic murmur, no diastolic murmur, no rubs, gallops, thrills, or heaves Abdomen: soft, nontender; no hepatosplenomehaly, BS+; abdominal aorta nontender and not dilated by palpation. Back: no CVA tenderness Pulses 2+ Musculoskeletal: full range of motion, normal strength, no joint deformities Extremities: no clubbing cyanosis or edema, Homan's sign negative  Neurologic: grossly nonfocal; Cranial nerves grossly wnl Psychologic: Normal mood and affect   Studies/Labs Reviewed:   EKG:  EKG is ordered today.  ECG (independently read by me): Normal sinus rhythm at 67 bpm with sinus arrhythmia.  QS complex V1 through V3.  Normal intervals.  No ST segment depression.  Recent Labs: BMP Latest Ref Rng & Units 07/14/2011  BUN 6 -  23 mg/dL 18  Creatinine 0.4 - 1.5 mg/dL 0.9     No flowsheet data found.  CBC Latest Ref Rng & Units 10/13/2011  Hemoglobin 13.0 - 17.0 g/dL 16.1   No results found for: MCV No results found for: TSH No results found for: HGBA1C   BNP    Component Value Date/Time   BNP WILL FOLLOW 11/08/2018 1231    ProBNP No results found for: PROBNP   Lipid Panel     Component Value Date/Time   CHOL 181 11/08/2018 1231   TRIG 75 11/08/2018 1231   HDL 64 11/08/2018 1231   CHOLHDL 2.8 11/08/2018 1231   LDLCALC 102 (H) 11/08/2018 1231     RADIOLOGY: No results found.   Additional studies/ records that were reviewed today include:  I have extensively reviewed the medical records provided by Dr. Catha Gosselin. I reviewed the laboratory: Hemoglobin 15.6, hematocrit 46.9, platelets 195. BUN 9 creatinine 0.48.  Glucose 89.  Potassium 4.6.  Normal LFTs. B12 958.  TSH 1.98.  PSA 0.98.  ASSESSMENT:    1. Dyspnea on exertion   2. ALS (amyotrophic lateral sclerosis) (HCC)   3. Weight loss   4. Fatigue, unspecified type     PLAN:  MR. Allister Lessley is a pleasant 85 year old gentleman who was diagnosed with amyotrophic lateral sclerosis by his report in 2015 and has been followed at [facility].  He states he is in the plateau phase.  He is still able to walk and eat.  However, over the past 3 years there has been almost a 50 pound weight loss.  Recently the patient has noticed significant more fatigue.  He denies difficulty with sleeping and denies any excessive daytime sleepiness.  He notes shortness of breath with minimal activity.  Oftentimes he may have to rest when he blows dry his hair or shaves.  I have recommended that he undergo a 2D echo Doppler study to assess both systolic and diastolic function as well as valvular architecture.  I reviewed the laboratory which was just done at [facility].  He is not anemic.  Electrolytes are normal.  He did not have any abnormal LFTs.  TSH was normal as was  B12.  I am recommending that he undergo fasting lipid panel.  I am also recommending he undergo a brain natruretic peptide to his objectively assess volume status.  On exam he appears euvolemic and does not appear to have volume overload.  He denies any chest pain. At present I will not schedule him for a nuclear perfusion study and will await the results of his LV function and wall motion analysis.  ECG remained stable although QS complexes noted anteroseptally.  He denies any episodes in the past of chest tightness.  I will see him back in follow-up of his echo Doppler assessment and further evaluation and recommendations will be made at that time.   Medication Adjustments/Labs and Tests Ordered: Current medicines are reviewed at length with the patient today.  Concerns regarding medicines are outlined above.  Medication changes, Labs and Tests ordered today are listed in the Patient Instructions below. Patient Instructions  Medication Instructions:  The current medical regimen is effective;  continue present plan and medications.  If you need a refill on your cardiac medications before your next appointment, please call your pharmacy.   Lab work: Fasting Lipid, BNP If you have labs (blood work) drawn today and your tests are completely normal, you will receive your results only by: Marland Kitchen MyChart Message (if you have MyChart) OR . A paper copy in the mail If you have any lab test that is abnormal or we need to change your treatment, we will call you to review the results.  Testing/Procedures: Echocardiogram - Your physician has requested that you have an echocardiogram. Echocardiography is a painless test that uses sound waves to create images of your heart. It provides your doctor with information about the size and shape of your heart and how well your heart's chambers and valves are working. This procedure takes approximately one hour. There are no restrictions for this procedure. This will be  performed at our [facility] location - [address], [address].   Follow-Up: At [facility], you and your health needs are our priority.  As part of our continuing mission to provide you with exceptional heart care, we have created designated Provider Care Teams.  These Care Teams include your primary Cardiologist (physician) and Advanced Practice Providers (APPs -  Physician Assistants and Nurse Practitioners) who all work together to provide you with the care you need, when you need it. You will need a follow up appointment in 6 weeks.  Please call our office 2 months in advance to schedule this appointment.  You may see Dr.Yonathan Perrow or one of the following Advanced Practice Providers on your designated Care Team: Azalee Course, [place] . Micah Flesher, PA-C      Signed, Nicki Guadalajara, MD  11/09/2018 4:25 PM    [facility] [address], [place], [place]  [address] Phone: [phone]

## 2018-11-08 NOTE — Patient Instructions (Signed)
Medication Instructions:  The current medical regimen is effective;  continue present plan and medications.  If you need a refill on your cardiac medications before your next appointment, please call your pharmacy.   Lab work: Fasting Lipid, BNP If you have labs (blood work) drawn today and your tests are completely normal, you will receive your results only by: Marland Kitchen MyChart Message (if you have MyChart) OR . A paper copy in the mail If you have any lab test that is abnormal or we need to change your treatment, we will call you to review the results.  Testing/Procedures: Echocardiogram - Your physician has requested that you have an echocardiogram. Echocardiography is a painless test that uses sound waves to create images of your heart. It provides your doctor with information about the size and shape of your heart and how well your heart's chambers and valves are working. This procedure takes approximately one hour. There are no restrictions for this procedure. This will be performed at our [facility] location - [address], [address].   Follow-Up: At [facility], you and your health needs are our priority.  As part of our continuing mission to provide you with exceptional heart care, we have created designated Provider Care Teams.  These Care Teams include your primary Cardiologist (physician) and Advanced Practice Providers (APPs -  Physician Assistants and Nurse Practitioners) who all work together to provide you with the care you need, when you need it. You will need a follow up appointment in 6 weeks.  Please call our office 2 months in advance to schedule this appointment.  You may see Dr.Kelly or one of the following Advanced Practice Providers on your designated Care Team: Azalee Course, [place] . Micah Flesher, PA-C

## 2018-11-09 ENCOUNTER — Encounter: Payer: Self-pay | Admitting: Cardiovascular Disease

## 2018-11-09 LAB — LIPID PANEL
CHOL/HDL RATIO: 2.8 ratio (ref 0.0–5.0)
Cholesterol, Total: 181 mg/dL (ref 100–199)
HDL: 64 mg/dL (ref >39)
LDL CALC: 102 mg/dL — AB (ref 0–99)
TRIGLYCERIDES: 75 mg/dL (ref 0–149)
VLDL CHOLESTEROL CAL: 15 mg/dL (ref 5–40)

## 2018-11-09 LAB — BRAIN NATRIURETIC PEPTIDE: BNP: 30.5 pg/mL (ref 0.0–100.0)

## 2018-11-12 ENCOUNTER — Ambulatory Visit (HOSPITAL_COMMUNITY): Payer: Medicare Other | Attending: Cardiovascular Disease

## 2018-11-12 ENCOUNTER — Other Ambulatory Visit: Payer: Self-pay

## 2018-11-12 DIAGNOSIS — R0609 Other forms of dyspnea: Secondary | ICD-10-CM | POA: Diagnosis not present

## 2018-11-23 DIAGNOSIS — Z23 Encounter for immunization: Secondary | ICD-10-CM | POA: Diagnosis not present

## 2018-11-23 DIAGNOSIS — G1221 Amyotrophic lateral sclerosis: Secondary | ICD-10-CM | POA: Diagnosis not present

## 2018-11-23 DIAGNOSIS — R634 Abnormal weight loss: Secondary | ICD-10-CM | POA: Diagnosis not present

## 2018-11-23 DIAGNOSIS — R7989 Other specified abnormal findings of blood chemistry: Secondary | ICD-10-CM | POA: Diagnosis not present

## 2018-11-23 DIAGNOSIS — R262 Difficulty in walking, not elsewhere classified: Secondary | ICD-10-CM | POA: Diagnosis not present

## 2018-11-23 DIAGNOSIS — R5381 Other malaise: Secondary | ICD-10-CM | POA: Diagnosis not present

## 2018-11-23 DIAGNOSIS — Z79899 Other long term (current) drug therapy: Secondary | ICD-10-CM | POA: Diagnosis not present

## 2018-11-23 DIAGNOSIS — M6281 Muscle weakness (generalized): Secondary | ICD-10-CM | POA: Diagnosis not present

## 2018-11-23 DIAGNOSIS — R490 Dysphonia: Secondary | ICD-10-CM | POA: Diagnosis not present

## 2018-11-24 ENCOUNTER — Ambulatory Visit
Admission: RE | Admit: 2018-11-24 | Discharge: 2018-11-24 | Disposition: A | Discharge status: Home / Self Care | Payer: Medicare Other | Source: Ambulatory Visit | Attending: Family Medicine | Admitting: Family Medicine

## 2018-11-24 ENCOUNTER — Ambulatory Visit: Payer: Medicare Other

## 2018-11-24 DIAGNOSIS — R928 Other abnormal and inconclusive findings on diagnostic imaging of breast: Secondary | ICD-10-CM | POA: Diagnosis not present

## 2018-11-24 DIAGNOSIS — N6320 Unspecified lump in the left breast, unspecified quadrant: Secondary | ICD-10-CM

## 2018-12-27 ENCOUNTER — Ambulatory Visit: Payer: Medicare Other | Admitting: Cardiovascular Disease

## 2019-05-24 DIAGNOSIS — E559 Vitamin D deficiency, unspecified: Secondary | ICD-10-CM | POA: Diagnosis not present

## 2019-05-24 DIAGNOSIS — M6281 Muscle weakness (generalized): Secondary | ICD-10-CM | POA: Diagnosis not present

## 2019-05-24 DIAGNOSIS — R471 Dysarthria and anarthria: Secondary | ICD-10-CM | POA: Diagnosis not present

## 2019-05-24 DIAGNOSIS — G1221 Amyotrophic lateral sclerosis: Secondary | ICD-10-CM | POA: Diagnosis not present

## 2019-05-24 DIAGNOSIS — R634 Abnormal weight loss: Secondary | ICD-10-CM | POA: Diagnosis not present

## 2019-05-24 DIAGNOSIS — Z79899 Other long term (current) drug therapy: Secondary | ICD-10-CM | POA: Diagnosis not present

## 2019-05-24 DIAGNOSIS — R531 Weakness: Secondary | ICD-10-CM | POA: Diagnosis not present

## 2019-05-24 DIAGNOSIS — K219 Gastro-esophageal reflux disease without esophagitis: Secondary | ICD-10-CM | POA: Diagnosis not present

## 2019-05-24 DIAGNOSIS — R262 Difficulty in walking, not elsewhere classified: Secondary | ICD-10-CM | POA: Diagnosis not present

## 2019-11-29 DIAGNOSIS — G1221 Amyotrophic lateral sclerosis: Secondary | ICD-10-CM | POA: Diagnosis not present

## 2020-03-06 DIAGNOSIS — R471 Dysarthria and anarthria: Secondary | ICD-10-CM | POA: Diagnosis not present

## 2020-03-06 DIAGNOSIS — G1221 Amyotrophic lateral sclerosis: Secondary | ICD-10-CM | POA: Diagnosis not present

## 2020-03-13 DIAGNOSIS — E43 Unspecified severe protein-calorie malnutrition: Secondary | ICD-10-CM | POA: Diagnosis not present

## 2020-03-13 DIAGNOSIS — Z515 Encounter for palliative care: Secondary | ICD-10-CM | POA: Diagnosis not present

## 2020-03-13 DIAGNOSIS — G1221 Amyotrophic lateral sclerosis: Secondary | ICD-10-CM | POA: Diagnosis not present

## 2020-03-13 DIAGNOSIS — Z1331 Encounter for screening for depression: Secondary | ICD-10-CM | POA: Diagnosis not present

## 2020-03-13 DIAGNOSIS — E46 Unspecified protein-calorie malnutrition: Secondary | ICD-10-CM | POA: Diagnosis not present

## 2020-03-20 DIAGNOSIS — E46 Unspecified protein-calorie malnutrition: Secondary | ICD-10-CM | POA: Diagnosis not present

## 2020-03-20 DIAGNOSIS — R410 Disorientation, unspecified: Secondary | ICD-10-CM | POA: Diagnosis not present

## 2020-03-20 DIAGNOSIS — R638 Other symptoms and signs concerning food and fluid intake: Secondary | ICD-10-CM | POA: Diagnosis not present

## 2020-03-20 DIAGNOSIS — Z515 Encounter for palliative care: Secondary | ICD-10-CM | POA: Diagnosis not present

## 2020-03-20 DIAGNOSIS — N401 Enlarged prostate with lower urinary tract symptoms: Secondary | ICD-10-CM | POA: Diagnosis not present

## 2020-03-20 DIAGNOSIS — E538 Deficiency of other specified B group vitamins: Secondary | ICD-10-CM | POA: Diagnosis not present

## 2020-03-20 DIAGNOSIS — R5383 Other fatigue: Secondary | ICD-10-CM | POA: Diagnosis not present

## 2020-03-20 DIAGNOSIS — G1221 Amyotrophic lateral sclerosis: Secondary | ICD-10-CM | POA: Diagnosis not present

## 2020-03-23 DIAGNOSIS — G1221 Amyotrophic lateral sclerosis: Secondary | ICD-10-CM | POA: Diagnosis not present

## 2020-03-23 DIAGNOSIS — R410 Disorientation, unspecified: Secondary | ICD-10-CM | POA: Diagnosis not present

## 2020-03-23 DIAGNOSIS — N401 Enlarged prostate with lower urinary tract symptoms: Secondary | ICD-10-CM | POA: Diagnosis not present

## 2020-03-23 DIAGNOSIS — E538 Deficiency of other specified B group vitamins: Secondary | ICD-10-CM | POA: Diagnosis not present

## 2020-03-23 DIAGNOSIS — E46 Unspecified protein-calorie malnutrition: Secondary | ICD-10-CM | POA: Diagnosis not present

## 2020-03-23 DIAGNOSIS — R5383 Other fatigue: Secondary | ICD-10-CM | POA: Diagnosis not present

## 2020-03-27 DIAGNOSIS — R410 Disorientation, unspecified: Secondary | ICD-10-CM | POA: Diagnosis not present

## 2020-03-27 DIAGNOSIS — E538 Deficiency of other specified B group vitamins: Secondary | ICD-10-CM | POA: Diagnosis not present

## 2020-03-27 DIAGNOSIS — Z515 Encounter for palliative care: Secondary | ICD-10-CM | POA: Diagnosis not present

## 2020-03-27 DIAGNOSIS — R5383 Other fatigue: Secondary | ICD-10-CM | POA: Diagnosis not present

## 2020-03-27 DIAGNOSIS — E46 Unspecified protein-calorie malnutrition: Secondary | ICD-10-CM | POA: Diagnosis not present

## 2020-03-27 DIAGNOSIS — G1221 Amyotrophic lateral sclerosis: Secondary | ICD-10-CM | POA: Diagnosis not present

## 2020-03-27 DIAGNOSIS — N401 Enlarged prostate with lower urinary tract symptoms: Secondary | ICD-10-CM | POA: Diagnosis not present

## 2020-03-27 DIAGNOSIS — R638 Other symptoms and signs concerning food and fluid intake: Secondary | ICD-10-CM | POA: Diagnosis not present

## 2020-03-30 DIAGNOSIS — N401 Enlarged prostate with lower urinary tract symptoms: Secondary | ICD-10-CM | POA: Diagnosis not present

## 2020-03-30 DIAGNOSIS — E46 Unspecified protein-calorie malnutrition: Secondary | ICD-10-CM | POA: Diagnosis not present

## 2020-03-30 DIAGNOSIS — E538 Deficiency of other specified B group vitamins: Secondary | ICD-10-CM | POA: Diagnosis not present

## 2020-03-30 DIAGNOSIS — R410 Disorientation, unspecified: Secondary | ICD-10-CM | POA: Diagnosis not present

## 2020-03-30 DIAGNOSIS — G1221 Amyotrophic lateral sclerosis: Secondary | ICD-10-CM | POA: Diagnosis not present

## 2020-03-30 DIAGNOSIS — R5383 Other fatigue: Secondary | ICD-10-CM | POA: Diagnosis not present

## 2020-04-26 DEATH — deceased

## 2020-11-25 IMAGING — MG DIGITAL DIAGNOSTIC BILATERAL MAMMOGRAM WITH TOMO AND CAD
6 of 10 series · 6 of 30 positions shown · non-contrast
Comparison: None

CLINICAL DATA: Patient presents with a retroareolar left breast
mass, without significant discomfort.He has been treated for
prostate carcinoma. The breast mass has developed following this
treatment as well as following weight loss.

EXAM:
DIGITAL DIAGNOSTIC BILATERAL MAMMOGRAM WITH CAD AND TOMO

[R CC synth-2D]
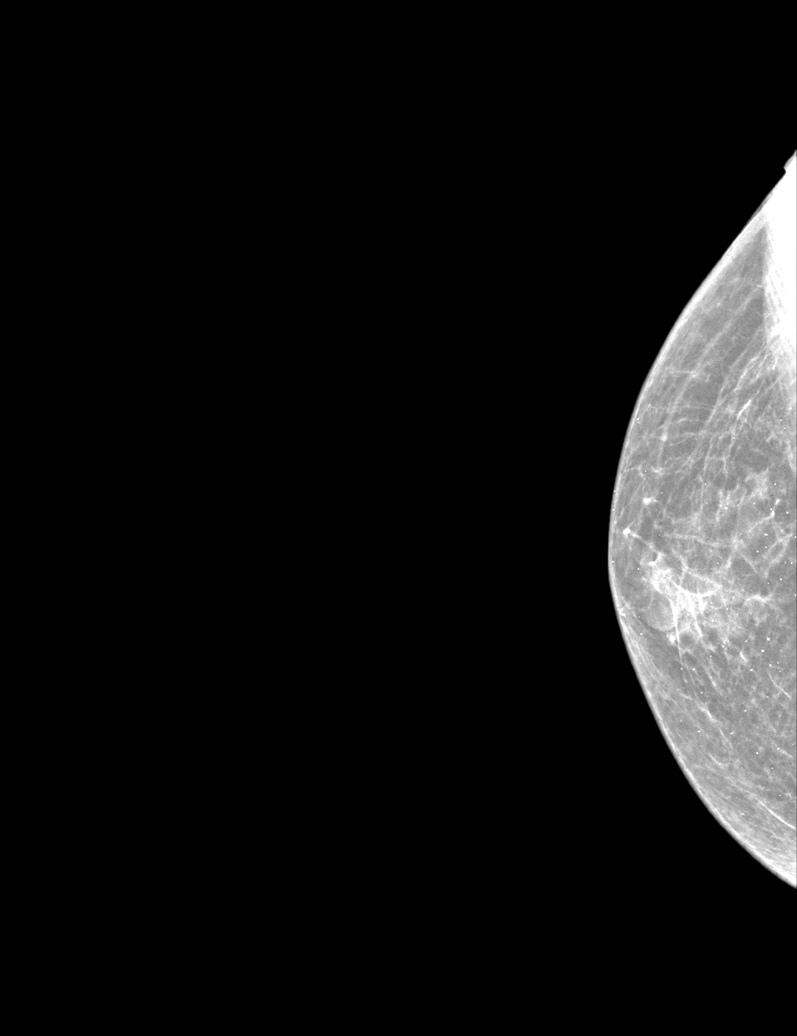

[R MLO synth-2D]
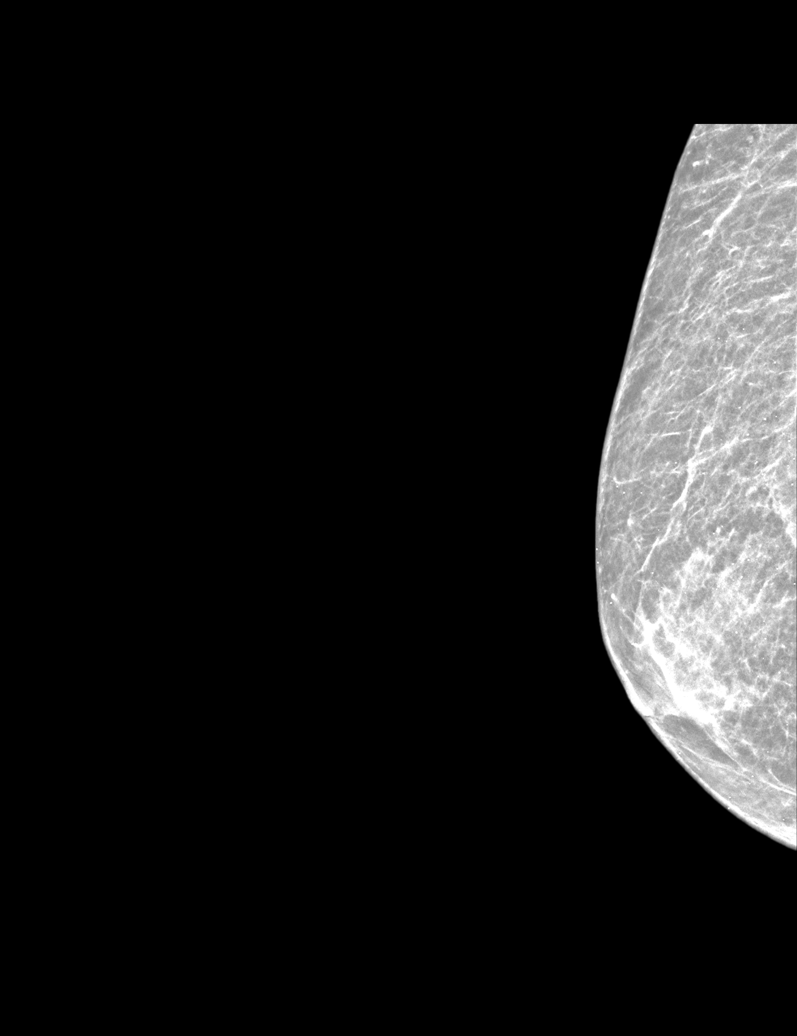

[L TAN synth-2D]
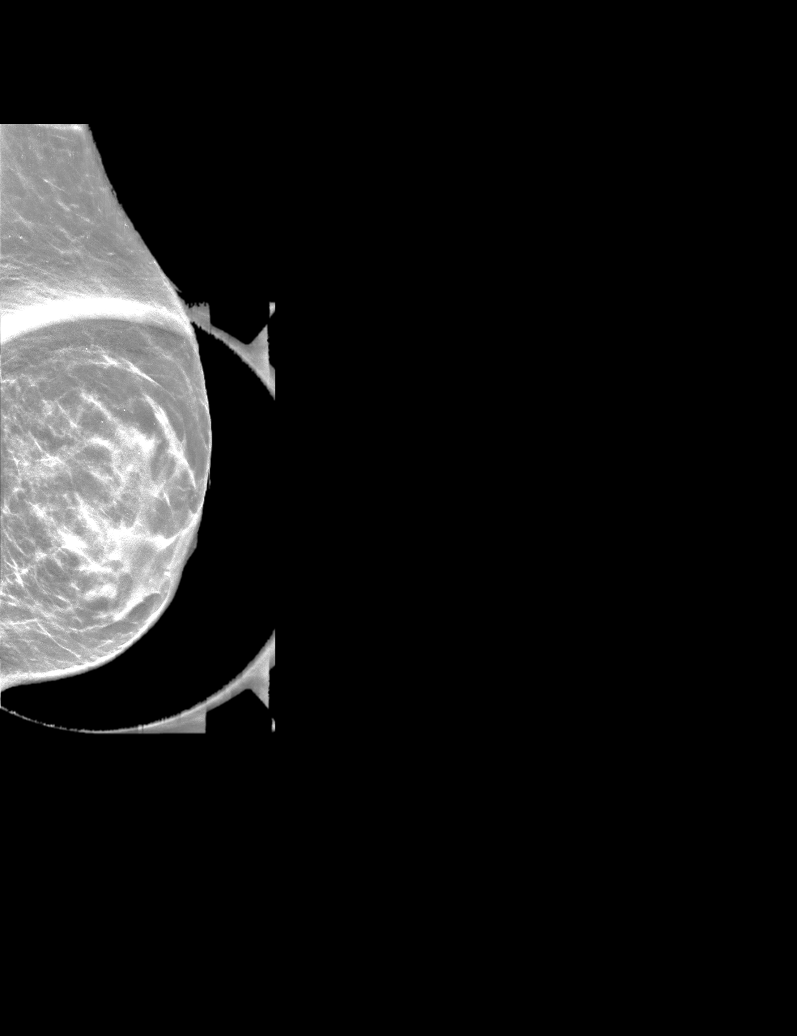

[L MLO synth-2D]
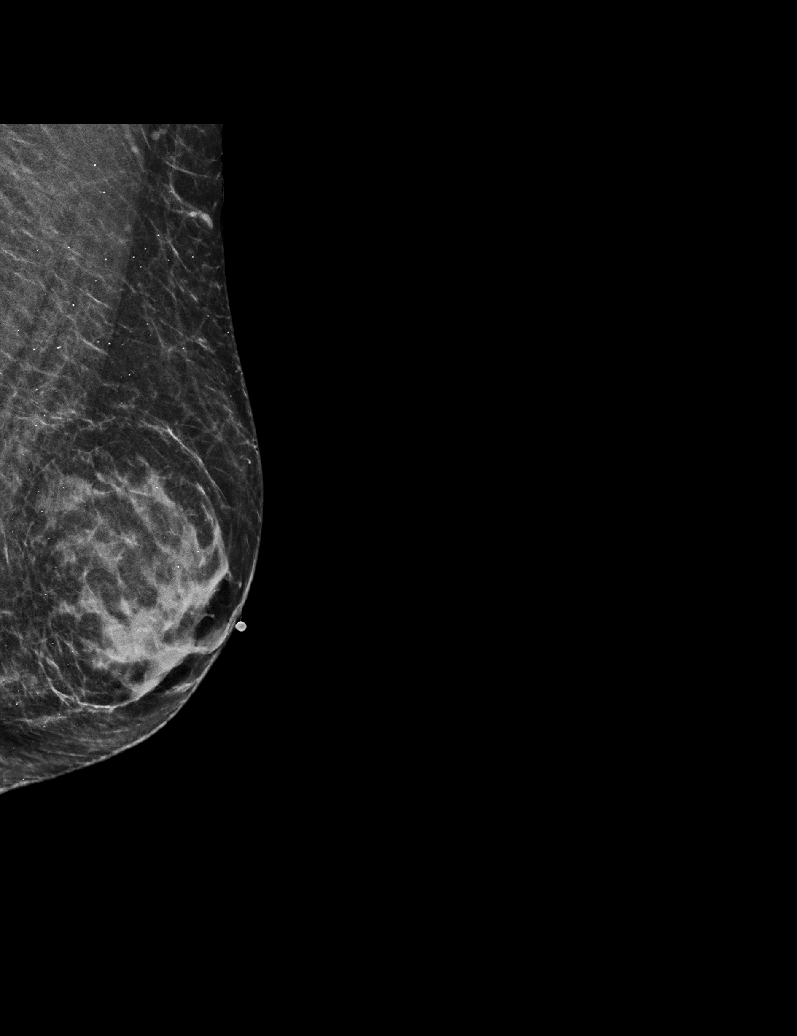

[L CC synth-2D]
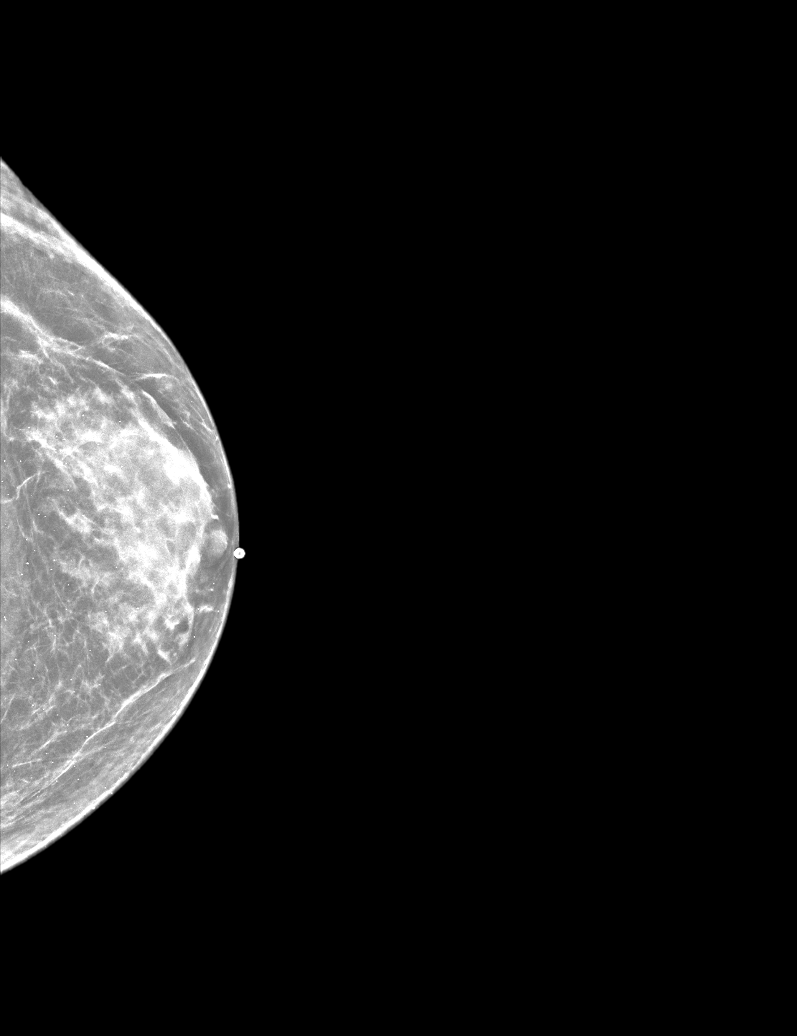

[R CC tomo · tomo slice 21/40.0]
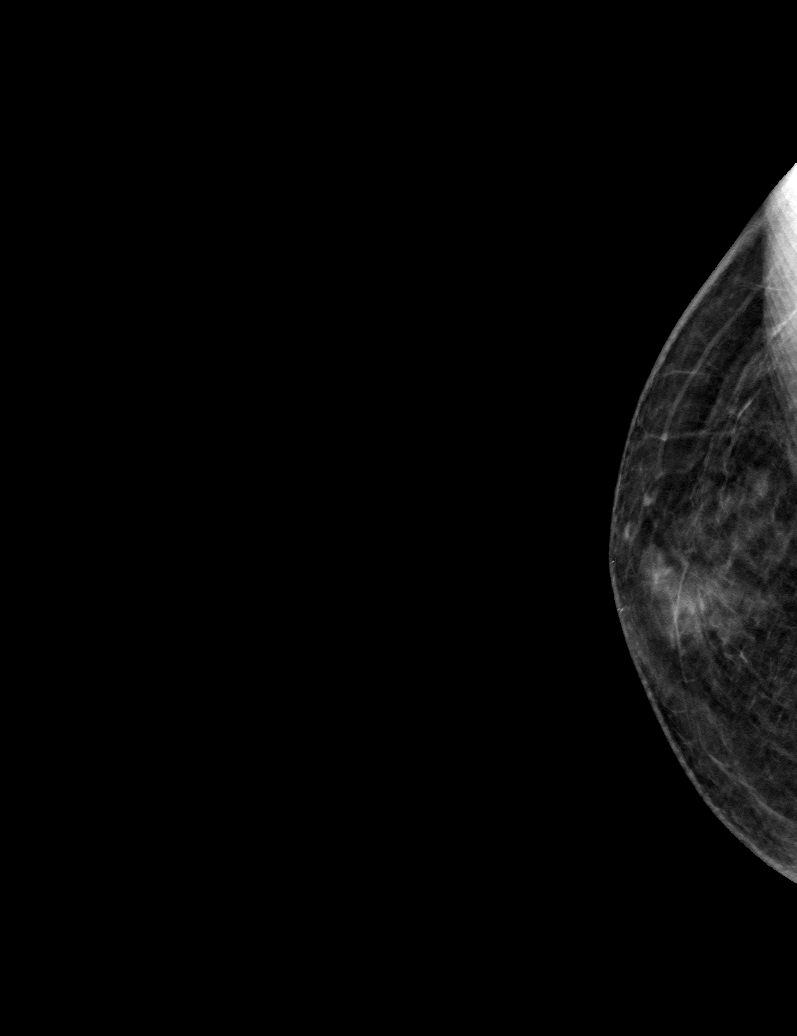

[6 of 30 positions shown; findings below may reference images not displayed]

ACR Breast Density Category b: There are scattered areas of
fibroglandular density.
FINDINGS: There is significant increased density in the retroareolar regions
of both breasts, significantly more on the left, with a pattern
typical of gynecomastia. There are no discrete masses or areas of
architectural distortion. There are no suspicious calcifications.

Mammographic images were processed with CAD.
IMPRESSION: 1. No evidence of breast malignancy.
2. Benign, left greater than right, gynecomastia.

RECOMMENDATION:
Clinical follow-up for the benign gynecomastia.

I have discussed the findings and recommendations with the patient.
Results were also provided in writing at the conclusion of the
visit. If applicable, a reminder letter will be sent to the patient
regarding the next appointment.

BI-RADS CATEGORY  2: Benign.
